# Patient Record
Sex: Male | Born: 1975 | Race: White | Hispanic: No | Marital: Married | Smoking: Current every day smoker
Health system: Southern US, Community
[De-identification: ages and names within clinical notes are randomized; demographics above are authoritative.]

---

## 2005-03-14 ENCOUNTER — Emergency Department (HOSPITAL_COMMUNITY): Admission: EM | Admit: 2005-03-14 | Discharge: 2005-03-14 | Payer: Self-pay | Admitting: Family Medicine

## 2005-06-21 ENCOUNTER — Emergency Department (HOSPITAL_COMMUNITY): Admission: EM | Admit: 2005-06-21 | Discharge: 2005-06-21 | Payer: Self-pay | Admitting: Emergency Medicine

## 2005-07-10 ENCOUNTER — Emergency Department (HOSPITAL_COMMUNITY): Admission: EM | Admit: 2005-07-10 | Discharge: 2005-07-10 | Payer: Self-pay | Admitting: Family Medicine

## 2006-04-20 ENCOUNTER — Emergency Department (HOSPITAL_COMMUNITY): Admission: EM | Admit: 2006-04-20 | Discharge: 2006-04-20 | Payer: Self-pay | Admitting: Emergency Medicine

## 2008-04-10 ENCOUNTER — Emergency Department (HOSPITAL_COMMUNITY): Admission: EM | Admit: 2008-04-10 | Discharge: 2008-04-10 | Payer: Self-pay | Admitting: Emergency Medicine

## 2008-04-16 ENCOUNTER — Emergency Department (HOSPITAL_COMMUNITY): Admission: EM | Admit: 2008-04-16 | Discharge: 2008-04-16 | Payer: Self-pay | Admitting: Emergency Medicine

## 2008-07-27 ENCOUNTER — Encounter: Admission: RE | Admit: 2008-07-27 | Discharge: 2008-08-24 | Payer: Self-pay | Admitting: Family Medicine

## 2010-06-30 ENCOUNTER — Encounter: Payer: Self-pay | Admitting: Emergency Medicine

## 2010-07-21 ENCOUNTER — Emergency Department (HOSPITAL_COMMUNITY)
Admission: EM | Admit: 2010-07-21 | Discharge: 2010-07-21 | Disposition: A | Discharge status: Home / Self Care | Payer: Managed Care, Other (non HMO) | Attending: Emergency Medicine | Admitting: Emergency Medicine

## 2010-07-21 DIAGNOSIS — Y929 Unspecified place or not applicable: Secondary | ICD-10-CM | POA: Insufficient documentation

## 2010-07-21 DIAGNOSIS — S7010XA Contusion of unspecified thigh, initial encounter: Secondary | ICD-10-CM | POA: Insufficient documentation

## 2010-07-21 DIAGNOSIS — M79609 Pain in unspecified limb: Secondary | ICD-10-CM | POA: Insufficient documentation

## 2010-07-21 DIAGNOSIS — W11XXXA Fall on and from ladder, initial encounter: Secondary | ICD-10-CM | POA: Insufficient documentation

## 2010-07-21 DIAGNOSIS — M7989 Other specified soft tissue disorders: Secondary | ICD-10-CM | POA: Insufficient documentation

## 2010-07-23 ENCOUNTER — Emergency Department (HOSPITAL_COMMUNITY)
Admission: EM | Admit: 2010-07-23 | Discharge: 2010-07-23 | Disposition: A | Discharge status: Home / Self Care | Payer: Managed Care, Other (non HMO) | Attending: Emergency Medicine | Admitting: Emergency Medicine

## 2010-07-23 DIAGNOSIS — R51 Headache: Secondary | ICD-10-CM | POA: Insufficient documentation

## 2010-07-23 DIAGNOSIS — R42 Dizziness and giddiness: Secondary | ICD-10-CM | POA: Insufficient documentation

## 2010-07-23 DIAGNOSIS — IMO0001 Reserved for inherently not codable concepts without codable children: Secondary | ICD-10-CM | POA: Insufficient documentation

## 2010-07-23 DIAGNOSIS — J069 Acute upper respiratory infection, unspecified: Secondary | ICD-10-CM | POA: Insufficient documentation

## 2010-07-23 DIAGNOSIS — J3489 Other specified disorders of nose and nasal sinuses: Secondary | ICD-10-CM | POA: Insufficient documentation

## 2010-07-23 DIAGNOSIS — R000 Tachycardia, unspecified: Secondary | ICD-10-CM | POA: Insufficient documentation

## 2010-07-23 DIAGNOSIS — H9209 Otalgia, unspecified ear: Secondary | ICD-10-CM | POA: Insufficient documentation

## 2014-05-02 ENCOUNTER — Emergency Department (HOSPITAL_COMMUNITY)
Admission: EM | Admit: 2014-05-02 | Discharge: 2014-05-02 | Disposition: A | Discharge status: Home / Self Care | Payer: Worker's Compensation | Attending: Emergency Medicine | Admitting: Emergency Medicine

## 2014-05-02 ENCOUNTER — Encounter (HOSPITAL_COMMUNITY): Payer: Self-pay | Admitting: Nurse Practitioner

## 2014-05-02 DIAGNOSIS — Y990 Civilian activity done for income or pay: Secondary | ICD-10-CM | POA: Insufficient documentation

## 2014-05-02 DIAGNOSIS — S0191XA Laceration without foreign body of unspecified part of head, initial encounter: Secondary | ICD-10-CM

## 2014-05-02 DIAGNOSIS — S0990XA Unspecified injury of head, initial encounter: Secondary | ICD-10-CM

## 2014-05-02 DIAGNOSIS — S0101XA Laceration without foreign body of scalp, initial encounter: Secondary | ICD-10-CM | POA: Diagnosis not present

## 2014-05-02 DIAGNOSIS — Z23 Encounter for immunization: Secondary | ICD-10-CM | POA: Insufficient documentation

## 2014-05-02 DIAGNOSIS — Y9269 Other specified industrial and construction area as the place of occurrence of the external cause: Secondary | ICD-10-CM | POA: Insufficient documentation

## 2014-05-02 DIAGNOSIS — Z720 Tobacco use: Secondary | ICD-10-CM | POA: Diagnosis not present

## 2014-05-02 DIAGNOSIS — W228XXA Striking against or struck by other objects, initial encounter: Secondary | ICD-10-CM | POA: Insufficient documentation

## 2014-05-02 DIAGNOSIS — Y9389 Activity, other specified: Secondary | ICD-10-CM | POA: Diagnosis not present

## 2014-05-02 MED ORDER — TETANUS-DIPHTH-ACELL PERTUSSIS 5-2.5-18.5 LF-MCG/0.5 IM SUSP
0.5000 mL | Freq: Once | INTRAMUSCULAR | Status: AC
  Administered 2014-05-02: 0.5 mL via INTRAMUSCULAR
  Filled 2014-05-02: qty 0.5

## 2014-05-02 MED ORDER — BACITRACIN ZINC 500 UNIT/GM EX OINT
TOPICAL_OINTMENT | CUTANEOUS | Status: AC
  Administered 2014-05-02: 1 via TOPICAL

## 2014-05-02 MED ORDER — BACITRACIN ZINC 500 UNIT/GM EX OINT
TOPICAL_OINTMENT | CUTANEOUS | Status: AC
  Filled 2014-05-02: qty 1.8

## 2014-05-02 NOTE — ED Provider Notes (Addendum)
CSN: [id number]     Arrival date & time 05/02/14  1900 History  This chart was scribed for non-physician practitioner working with Ward GivensIva L Knapp, MD by Angelene GiovanniEmmanuella Mensah, ED Scribe. The patient was seen in room WTR7/WTR7 and the patient's care was started at 8:18 PM    Chief Complaint  Patient presents with  . Head Laceration   The history is provided by the patient. No language interpreter was used.   HPI Comments: Arthur Wallace is a 38 y.o. male who presents to the Emergency Department complaining of a head laceration onset 2 hours ago after he was hit in the head with a sharp metal object on the top of his head while at work. He reports associated pain to the area. He states that at first he did not feel any pain but after a few minutes, he felt the pain. He denies LOC but his friend explains that he was "dazed" for a couple of minutes after he was hit. He denies HA, dizziness, or any other pain. He denies being on blood thinners. He denies being on blood thinners. His friend reports that his tetanus shot was UTD.   History reviewed. No pertinent past medical history. History reviewed. No pertinent past surgical history. No family history on file. History  Substance Use Topics  . Smoking status: Current Every Day Smoker -- 0.50 packs/day    Types: Cigarettes  . Smokeless tobacco: Former NeurosurgeonUser  . Alcohol Use: Yes    Review of Systems  Constitutional: Negative for fever and chills.  Cardiovascular: Negative for chest pain.  Gastrointestinal: Negative for abdominal pain.  Musculoskeletal: Negative for back pain and neck pain.  Skin:       Head Laceration  Neurological: Negative for dizziness and headaches.      Allergies  Review of patient's allergies indicates no known allergies.  Home Medications   Prior to Admission medications   Not on File   BP 136/83 mmHg  Pulse 105  Temp(Src) 98.5 F (36.9 C) (Oral)  Resp 22  Ht 5\' 7"  (1.702 m)  Wt 175 lb (79.379 kg)  BMI 27.40  kg/m2  SpO2 100% Physical Exam  Constitutional: He is oriented to person, place, and time. He appears well-developed and well-nourished.  HENT:  Head: Normocephalic and atraumatic.  Right Ear: External ear normal.  Left Ear: External ear normal.  Mouth/Throat: Oropharynx is clear and moist.  5cm laceration to the right scalp. Hemostatic. No scull deformity. No hemotympanum  Neck: Neck supple.  Cardiovascular: Normal rate.   Pulmonary/Chest: Effort normal.  Abdominal: He exhibits no distension.  Neurological: He is alert and oriented to person, place, and time.  Skin: Skin is warm and dry.  Psychiatric: He has a normal mood and affect.  Nursing note and vitals reviewed.   ED Course  Procedures (including critical care time) DIAGNOSTIC STUDIES: Oxygen Saturation is 100% on RA, normal by my interpretation.    COORDINATION OF CARE: 8:25 PM- Pt advised of plan for treatment and pt agrees.    Labs Review Labs Reviewed - No data to display  Imaging Review No results found.   EKG Interpretation None      MDM   Final diagnoses:  Laceration of head, initial encounter  Minor head injury, initial encounter    Patient is here with a scalp laceration after a sharp metal bar fell on his head. Tetanus updated in ED. Repaired laceration with staples. No evidence of major head trauma, no further imaging indicated  based on [place] rules. Will discharge home with strict precautions.  Filed Vitals:   05/02/14 1919  BP: 136/83  Pulse: 105  Temp: 98.5 F (36.9 C)  Resp: 22     I personally performed the services described in this documentation, which was scribed in my presence. The recorded information has been reviewed and is accurate.    Lottie Musselatyana A Tiny Chaudhary, PA-C 05/02/14 2052  Ward GivensIva L Knapp, MD 05/02/14 2100  Addendum: procedure note  LACERATION REPAIR Performed by: Lottie MusselKIRICHENKO, Antavia Tandy A Authorized by: Jaynie CrumbleKIRICHENKO, Kachina Niederer A Consent: Verbal consent  obtained. Risks and benefits: risks, benefits and alternatives were discussed Consent given by: patient Patient identity confirmed: provided demographic data Prepped and Draped in normal sterile fashion Wound explored  Laceration Location: scalp  Laceration Length: 5cm  No Foreign Bodies seen or palpated  Anesthesia:none Amount of cleaning: standard  Skin closure: staples  Number of sutures: 4  Patient tolerance: Patient tolerated the procedure well with no immediate complications.   Lottie Musselatyana A Treyson Axel, PA-C 05/02/14 2216  Ward GivensIva L Knapp, MD 05/02/14 2250

## 2014-05-02 NOTE — Discharge Instructions (Signed)
Ibuprofen and tylenol for pain. Ice. Bacitracin twice a day. Follow up with primary care doctor or urgent care for staple removal.    Head Injury You have received a head injury. It does not appear serious at this time. Headaches and vomiting are common following head injury. It should be easy to awaken from sleeping. Sometimes it is necessary for you to stay in the emergency department for a while for observation. Sometimes admission to the hospital may be needed. After injuries such as yours, most problems occur within the first 24 hours, but side effects may occur up to 7-10 days after the injury. It is important for you to carefully monitor your condition and contact your health care provider or seek immediate medical care if there is a change in your condition. WHAT ARE THE TYPES OF HEAD INJURIES? Head injuries can be as minor as a bump. Some head injuries can be more severe. More severe head injuries include:  A jarring injury to the brain (concussion).  A bruise of the brain (contusion). This mean there is bleeding in the brain that can cause swelling.  A cracked skull (skull fracture).  Bleeding in the brain that collects, clots, and forms a bump (hematoma). WHAT CAUSES A HEAD INJURY? A serious head injury is most likely to happen to someone who is in a car wreck and is not wearing a seat belt. Other causes of major head injuries include bicycle or motorcycle accidents, sports injuries, and falls. HOW ARE HEAD INJURIES DIAGNOSED? A complete history of the event leading to the injury and your current symptoms will be helpful in diagnosing head injuries. Many times, pictures of the brain, such as CT or MRI are needed to see the extent of the injury. Often, an overnight hospital stay is necessary for observation.  WHEN SHOULD I SEEK IMMEDIATE MEDICAL CARE?  You should get help right away if:  You have confusion or drowsiness.  You feel sick to your stomach (nauseous) or have continued,  forceful vomiting.  You have dizziness or unsteadiness that is getting worse.  You have severe, continued headaches not relieved by medicine. Only take over-the-counter or prescription medicines for pain, fever, or discomfort as directed by your health care provider.  You do not have normal function of the arms or legs or are unable to walk.  You notice changes in the black spots in the center of the colored part of your eye (pupil).  You have a clear or bloody fluid coming from your nose or ears.  You have a loss of vision. During the next 24 hours after the injury, you must stay with someone who can watch you for the warning signs. This person should contact local emergency services (911 in the [place] if you have seizures, you become unconscious, or you are unable to wake up. HOW CAN I PREVENT A HEAD INJURY IN THE FUTURE? The most important factor for preventing major head injuries is avoiding motor vehicle accidents. To minimize the potential for damage to your head, it is crucial to wear seat belts while riding in motor vehicles. Wearing helmets while bike riding and playing collision sports (like football) is also helpful. Also, avoiding dangerous activities around the house will further help reduce your risk of head injury.  WHEN CAN I RETURN TO NORMAL ACTIVITIES AND ATHLETICS? You should be reevaluated by your health care provider before returning to these activities. If you have any of the following symptoms, you should not return to activities or  contact sports until 1 week after the symptoms have stopped:  Persistent headache.  Dizziness or vertigo.  Poor attention and concentration.  Confusion.  Memory problems.  Nausea or vomiting.  Fatigue or tire easily.  Irritability.  Intolerant of bright lights or loud noises.  Anxiety or depression.  Disturbed sleep. MAKE SURE YOU:   Understand these instructions.  Will watch your condition.  Will get help right away if  you are not doing well or get worse. Document Released: 05/26/2005 Document Revised: 05/31/2013 Document Reviewed: 01/31/2013 [facility] Patient Information 2015 [place], [place]. This information is not intended to replace advice given to you by your health care provider. Make sure you discuss any questions you have with your health care provider.  Staple Care and Removal Your caregiver has used staples today to repair your wound. Staples are used to help a wound heal faster by holding the edges of the wound together. The staples can be removed when the wound has healed well enough to stay together after the staples are removed. A dressing (wound covering), depending on the location of the wound, may have been applied. This may be changed once per day or as instructed. If the dressing sticks, it may be soaked off with soapy water or hydrogen peroxide. Only take over-the-counter or prescription medicines for pain, discomfort, or fever as directed by your caregiver.  If you did not receive a tetanus shot today because you did not recall when your last one was given, check with your caregiver when you have your staples removed to determine if one is needed. Return to your caregiver's office in 1 week or as suggested to have your staples removed. SEEK IMMEDIATE MEDICAL CARE IF:   You have redness, swelling, or increasing pain in the wound.  You have pus coming from the wound.  You have a fever.  You notice a bad smell coming from the wound or dressing.  Your wound edges break open after staples have been removed. Document Released: 02/18/2001 Document Revised: 08/18/2011 Document Reviewed: 03/05/2005 [facility] Patient Information 2015 [place], [place]. This information is not intended to replace advice given to you by your health care provider. Make sure you discuss any questions you have with your health care provider.

## 2014-05-02 NOTE — ED Notes (Signed)
Pt presents with 5cm long head laceration. States a metal road feel on him while he was at work. Scant bleeding at this time, denes being blood thinners, unknown status for tetanus shot.

## 2014-05-10 ENCOUNTER — Emergency Department (HOSPITAL_COMMUNITY)
Admission: EM | Admit: 2014-05-10 | Discharge: 2014-05-10 | Disposition: A | Discharge status: Home / Self Care | Payer: Worker's Compensation | Attending: Emergency Medicine | Admitting: Emergency Medicine

## 2014-05-10 ENCOUNTER — Encounter (HOSPITAL_COMMUNITY): Payer: Self-pay | Admitting: Emergency Medicine

## 2014-05-10 DIAGNOSIS — Z4802 Encounter for removal of sutures: Secondary | ICD-10-CM

## 2014-05-10 DIAGNOSIS — Z720 Tobacco use: Secondary | ICD-10-CM | POA: Diagnosis not present

## 2014-05-10 MED ORDER — TRAMADOL HCL 50 MG PO TABS
50.0000 mg | ORAL_TABLET | Freq: Once | ORAL | Status: AC
  Administered 2014-05-10: 50 mg via ORAL
  Filled 2014-05-10: qty 1

## 2014-05-10 MED ORDER — BACITRACIN ZINC 500 UNIT/GM EX OINT: 1.0000 "application " | TOPICAL_OINTMENT | Freq: Two times a day (BID) | CUTANEOUS | Status: DC

## 2014-05-10 NOTE — ED Notes (Signed)
Pt presents for staple removal. Staples placed at our facility 1 week ago in the top of his head. Alert and oriented.

## 2014-05-10 NOTE — ED Provider Notes (Signed)
CSN: [id number]     Arrival date & time 05/10/14  1552 History  This chart was scribed for non-physician practitioner, Marlon Peliffany Pieper Kasik, PA-C working with Mirian MoMatthew Gentry, MD by Greggory StallionKayla Andersen, ED scribe. This patient was seen in room WTR7/WTR7 and the patient's care was started at 4:13 PM.   Chief Complaint  Patient presents with  . Suture / Staple Removal   The history is provided by the patient. No language interpreter was used.    HPI Comments: Arthur Wallace is a 38 y.o. male who presents to the Emergency Department requesting staple removal. Pt had 4 staples placed to his scalp 8 days ago. States he originally got the injury at work where a piece of metal fell on his head. Denies pain or drainage from the area but reports mild headaches.   History reviewed. No pertinent past medical history. History reviewed. No pertinent past surgical history. History reviewed. No pertinent family history. History  Substance Use Topics  . Smoking status: Current Every Day Smoker -- 0.50 packs/day    Types: Cigarettes  . Smokeless tobacco: Former NeurosurgeonUser  . Alcohol Use: Yes    Review of Systems  Skin: Positive for wound.  Neurological: Positive for headaches.  All other systems reviewed and are negative.  Allergies  Review of patient's allergies indicates no known allergies.  Home Medications   Prior to Admission medications   Not on File   BP 127/79 mmHg  Pulse 100  Temp(Src) 98.6 F (37 C) (Oral)  SpO2 100%   Physical Exam  Constitutional: He is oriented to person, place, and time. He appears well-developed and well-nourished. No distress.  HENT:  Head: Normocephalic and atraumatic.    Eyes: Conjunctivae and EOM are normal.  Neck: Neck supple. No tracheal deviation present.  Cardiovascular: Normal rate.   Pulmonary/Chest: Effort normal. No respiratory distress.  Musculoskeletal: Normal range of motion.  Neurological: He is alert and oriented to person, place, and time.  Skin:  Skin is warm and dry.  Psychiatric: He has a normal mood and affect. His behavior is normal.  Nursing note and vitals reviewed.   ED Course  Procedures (including critical care time)  SUTURE REMOVAL Performed by: Marlon Peliffany Branko Steeves, PA-C Authorized by: Marlon Peliffany Kairo Laubacher, PA-C Consent: Verbal consent obtained. Consent given by: patient Required items: required blood products, implants, devices, and special equipment available  Time out: Immediately prior to procedure a "time out" was called to verify the correct patient, procedure, equipment, support staff and site/side marked as required. Location: scalp Wound Appearance: clean, well healing  Staples Removed: 4 Patient tolerance: Patient tolerated the procedure well with no immediate complications.   DIAGNOSTIC STUDIES: Oxygen Saturation is 100% on RA, normal by my interpretation.    COORDINATION OF CARE: 4:14 PM-Discussed treatment plan which includes staple removal with pt at bedside and pt agreed to plan.   Labs Review Labs Reviewed - No data to display  Imaging Review No results found.   EKG Interpretation None      MDM   Final diagnoses:  Encounter for staple removal    SUTURE REMOVAL Performed by: Dorthula MatasGREENE,Ianmichael Amescua G  Consent: Verbal consent obtained. Patient identity confirmed: provided demographic data Time out: Immediately prior to procedure a "time out" was called to verify the correct patient, procedure, equipment, support staff and site/side marked as required.  Location details: scalp  Wound Appearance: clean  Sutures/Staples Removed: 4  Facility: sutures placed in this facility Patient tolerance: Patient tolerated the procedure well with no immediate  complications.   38 y.o.Arthur Wallace's evaluation in the Emergency Department is complete. It has been determined that no acute conditions requiring further emergency intervention are present at this time. The patient/guardian have been advised of the  diagnosis and plan. We have discussed signs and symptoms that warrant return to the ED, such as changes or worsening in symptoms.  Vital signs are stable at discharge. Filed Vitals:   05/10/14 1559  BP: 127/79  Pulse: 100  Temp: 98.6 F (37 C)    Patient/guardian has voiced understanding and agreed to follow-up with the PCP or specialist.   I personally performed the services described in this documentation, which was scribed in my presence. The recorded information has been reviewed and is accurate.  Dorthula Matasiffany G Ovid Witman, PA-C 05/10/14 1619  Mirian MoMatthew Gentry, MD 05/14/14 [id number]

## 2014-05-10 NOTE — Discharge Instructions (Signed)
Incision Care An incision is when a surgeon cuts into your body tissues. After surgery, the incision needs to be cared for properly to prevent infection.  HOME CARE INSTRUCTIONS   Take all medicine as directed by your caregiver. Only take over-the-counter or prescription medicines for pain, discomfort, or fever as directed by your caregiver.  Do not remove your bandage (dressing) or get your incision wet until your surgeon gives you permission. In the event that your dressing becomes wet, dirty, or starts to smell, change the dressing and call your surgeon for instructions as soon as possible.  Take showers. Do not take tub baths, swim, or do anything that may soak the wound until it is healed.  Resume your normal diet and activities as directed or allowed.  Avoid lifting any weight until you are instructed otherwise.  Use anti-itch antihistamine medicine as directed by your caregiver. The wound may itch when it is healing. Do not pick or scratch at the wound.  Follow up with your caregiver for stitch (suture) or staple removal as directed.  Drink enough fluids to keep your urine clear or pale yellow. SEEK MEDICAL CARE IF:   You have redness, swelling, or increasing pain in the wound that is not controlled with medicine.  You have drainage, blood, or pus coming from the wound that lasts longer than 1 day.  You develop muscle aches, chills, or a general ill feeling.  You notice a bad smell coming from the wound or dressing.  Your wound edges separate after the sutures, staples, or skin adhesive strips have been removed.  You develop persistent nausea or vomiting. SEEK IMMEDIATE MEDICAL CARE IF:   You have a fever.  You develop a rash.  You develop dizzy episodes or faint while standing.  You have difficulty breathing.  You develop any reaction or side effects to medicine given. MAKE SURE YOU:   Understand these instructions.  Will watch your condition.  Will get help  right away if you are not doing well or get worse. Document Released: 12/13/2004 Document Revised: 08/18/2011 Document Reviewed: 07/20/2013 [facility] Patient Information 2015 [place]resExitCare, [place]LLC. This information is not intended to replace advice given to you by your health care provider. Make sure you discuss any questions you have with your health care provider.  Suture Removal, Care After Refer to this sheet in the next few weeks. These instructions provide you with information on caring for yourself after your procedure. Your health care provider may also give you more specific instructions. Your treatment has been planned according to current medical practices, but problems sometimes occur. Call your health care provider if you have any problems or questions after your procedure. WHAT TO EXPECT AFTER THE PROCEDURE After your stitches (sutures) are removed, it is typical to have the following:  Some discomfort and swelling in the wound area.  Slight redness in the area. HOME CARE INSTRUCTIONS   If you have skin adhesive strips over the wound area, do not take the strips off. They will fall off on their own in a few days. If the strips remain in place after 14 days, you may remove them.  Change any bandages (dressings) at least once a day or as directed by your health care provider. If the bandage sticks, soak it off with warm, soapy water.  Apply cream or ointment only as directed by your health care provider. If using cream or ointment, wash the area with soap and water 2 times a day to remove all  the cream or ointment. Rinse off the soap and pat the area dry with a clean towel.  Keep the wound area dry and clean. If the bandage becomes wet or dirty, or if it develops a bad smell, change it as soon as possible.  Continue to protect the wound from injury.  Use sunscreen when out in the sun. New scars become sunburned easily. SEEK MEDICAL CARE IF:  You have increasing redness, swelling, or  pain in the wound.  You see pus coming from the wound.  You have a fever.  You notice a bad smell coming from the wound or dressing.  Your wound breaks open (edges not staying together). Document Released: 02/18/2001 Document Revised: 03/16/2013 Document Reviewed: 01/05/2013 [facility] Patient Information 2015 [place]rineExitCare, [place]LLC. This information is not intended to replace advice given to you by your health care provider. Make sure you discuss any questions you have with your health care provider.

## 2014-07-13 ENCOUNTER — Ambulatory Visit (INDEPENDENT_AMBULATORY_CARE_PROVIDER_SITE_OTHER): Payer: Self-pay | Admitting: Family Medicine

## 2014-07-13 VITALS — BP 106/66 | HR 88 | Temp 98.1°F | Resp 16 | Ht 67.0 in | Wt 187.6 lb

## 2014-07-13 DIAGNOSIS — Z024 Encounter for examination for driving license: Secondary | ICD-10-CM

## 2014-07-13 DIAGNOSIS — Z029 Encounter for administrative examinations, unspecified: Secondary | ICD-10-CM

## 2014-07-13 NOTE — Progress Notes (Signed)
Physical examination for DOT  Physical exam: Patient is here for his DOT physical examination. He has no major acute medical complaints    Past medical history: Operations: None Medical illnesses: None Medications: None Allergies: None   Social history: [place] is single, [place]. 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 His family is all overseas. He uses vapors, not smoking cigarettes currently he does intend to quit.   Review of systems: Constitutional: Unremarkable HEENT: Unremarkable Does not wear glasses Respiratory: Unremarkable Cardiovascular: Unremarkable Gastrointestinal: Unremarkable Genitourinary: Unremarkable Muscular skeletal: Unremarkable Neurologic: Unremarkable Psychiatric: Unremarkable Dermatologic: Unremarkable Endocrinologic: Unremarkable  Physical examination: Well-developed well-nourished man in no acute distress. TMs normal. Eyes PERRLA. Short stature. Throat clear. Neck supple without nodes or thyromegaly. No carotid bruits. Chest is clear to auscultation. Heart regular without murmurs gallops or arrhythmias. Abdomen soft without masses or tenderness. Normal male external genitalia, testes descended. No hernias. Extremities unremarkable. Skin unremarkable. Has a benign nevus on his posterior calf left leg.  Assessment: Normal physical examination for DOT  Plan: 2 year DOT card Return if needed

## 2014-07-13 NOTE — Patient Instructions (Signed)
Return as necessary.  DOT card is for 2 years

## 2014-12-20 ENCOUNTER — Other Ambulatory Visit: Payer: Self-pay | Admitting: Internal Medicine

## 2014-12-20 DIAGNOSIS — R222 Localized swelling, mass and lump, trunk: Secondary | ICD-10-CM

## 2014-12-27 ENCOUNTER — Ambulatory Visit: Payer: Managed Care, Other (non HMO)

## 2014-12-27 ENCOUNTER — Other Ambulatory Visit: Payer: Self-pay

## 2014-12-28 ENCOUNTER — Ambulatory Visit
Admission: RE | Admit: 2014-12-28 | Discharge: 2014-12-28 | Disposition: A | Discharge status: Home / Self Care | Payer: Self-pay | Source: Ambulatory Visit | Attending: Internal Medicine | Admitting: Internal Medicine

## 2014-12-28 ENCOUNTER — Other Ambulatory Visit: Payer: Self-pay | Admitting: Internal Medicine

## 2014-12-28 ENCOUNTER — Ambulatory Visit
Admission: RE | Admit: 2014-12-28 | Discharge: 2014-12-28 | Disposition: A | Discharge status: Home / Self Care | Payer: Managed Care, Other (non HMO) | Source: Ambulatory Visit | Attending: Internal Medicine | Admitting: Internal Medicine

## 2014-12-28 DIAGNOSIS — R928 Other abnormal and inconclusive findings on diagnostic imaging of breast: Secondary | ICD-10-CM | POA: Insufficient documentation

## 2014-12-28 DIAGNOSIS — R222 Localized swelling, mass and lump, trunk: Secondary | ICD-10-CM

## 2015-01-01 ENCOUNTER — Other Ambulatory Visit: Payer: Self-pay

## 2015-01-01 ENCOUNTER — Ambulatory Visit: Admission: RE | Admit: 2015-01-01 | Payer: Managed Care, Other (non HMO) | Source: Ambulatory Visit

## 2017-06-12 ENCOUNTER — Encounter: Payer: Self-pay | Admitting: Urgent Care

## 2017-06-12 ENCOUNTER — Ambulatory Visit: Payer: Self-pay | Admitting: Urgent Care

## 2017-06-12 VITALS — BP 110/72 | HR 95 | Temp 98.5°F | Resp 16 | Ht 67.0 in | Wt 183.6 lb

## 2017-06-12 DIAGNOSIS — Z024 Encounter for examination for driving license: Secondary | ICD-10-CM

## 2017-06-12 DIAGNOSIS — Z720 Tobacco use: Secondary | ICD-10-CM

## 2017-06-12 NOTE — Progress Notes (Signed)
   Airline pilotCommercial Driver Medical Examination   Arthur Wallace is a 42 y.o. male who presents today for a DOT physical exam. The patient reports smoking 1/2 ppd. Denies dizziness, chronic headache, blurred vision, chest pain, shortness of breath, heart racing, palpitations, nausea, vomiting, abdominal pain, hematuria, lower leg swelling.  The following portions of the patient's history were reviewed and updated as appropriate: allergies, current medications, past family history, past medical history, past social history and past surgical history.  Objective:   BP 110/72   Pulse 95   Temp 98.5 F (36.9 C) (Oral)   Resp 16   Ht 5\' 7"  (1.702 m)   Wt 183 lb 9.6 oz (83.3 kg)   SpO2 97%   BMI 28.76 kg/m   Vision/hearing:  Visual Acuity Screening   Right eye Left eye Both eyes  Without correction: 20/13-1 20/13 20/13  With correction:     Comments: Peripheral Vision: Right eye 85 degrees. Left eye 85 degrees.  The patient can distinguish the colors red, amber and green.  Hearing Screening Comments: The patient was able to hear a forced whisper from 10 feet.  Patient can recognize and distinguish among traffic control signals and devices showing standard red, green, and amber colors.  Corrective lenses required: No  Monocular Vision?: No  Hearing aid requirement: No  Physical Exam  Constitutional: He is oriented to person, place, and time. He appears well-developed and well-nourished.  HENT:  TM's intact bilaterally, no effusions or erythema. Nasal turbinates pink and moist, nasal passages patent. No sinus tenderness. Oropharynx clear, mucous membranes moist, dentition in good repair.  Eyes: Conjunctivae and EOM are normal. Pupils are equal, round, and reactive to light. Right eye exhibits no discharge. Left eye exhibits no discharge. No scleral icterus.  Neck: Normal range of motion. Neck supple. No thyromegaly present.  Cardiovascular: Normal rate, regular rhythm and intact  distal pulses. Exam reveals no gallop and no friction rub.  No murmur heard. Pulmonary/Chest: No stridor. No respiratory distress. He has no wheezes. He has no rales.  Abdominal: Soft. Bowel sounds are normal. He exhibits no distension and no mass. There is no tenderness.  Musculoskeletal: Normal range of motion. He exhibits no edema or tenderness.  Lymphadenopathy:    He has no cervical adenopathy.  Neurological: He is alert and oriented to person, place, and time. He has normal reflexes.  Skin: Skin is warm and dry. No rash noted. No erythema. No pallor.  Psychiatric: He has a normal mood and affect.    Labs: Comments: Urine Specimen:  SpGr:  1.020  Protein:  Trace   Sugar:  Negative  Blood: Negative  Assessment:    Healthy male exam.  Meets standards in 9149 CFR 391.41;  qualifies for 2 year certificate.    Plan:   Medical examiners certificate completed and printed. Return as needed.  Wallis BambergMario Draedyn Weidinger, PA-C [facility] [phone] 06/12/2017  4:46 PM

## 2017-06-12 NOTE — Patient Instructions (Signed)
Health Maintenance, Male A healthy lifestyle and preventive care is important for your health and wellness. Ask your health care provider about what schedule of regular examinations is right for you. What should I know about weight and diet? Eat a Healthy Diet  Eat plenty of vegetables, fruits, whole grains, low-fat dairy products, and lean protein.  Do not eat a lot of foods high in solid fats, added sugars, or salt.  Maintain a Healthy Weight Regular exercise can help you achieve or maintain a healthy weight. You should:  Do at least 150 minutes of exercise each week. The exercise should increase your heart rate and make you sweat (moderate-intensity exercise).  Do strength-training exercises at least twice a week.  Watch Your Levels of Cholesterol and Blood Lipids  Have your blood tested for lipids and cholesterol every 5 years starting at 43 years of age. If you are at high risk for heart disease, you should start having your blood tested when you are 43 years old. You may need to have your cholesterol levels checked more often if: ? Your lipid or cholesterol levels are high. ? You are older than 43 years of age. ? You are at high risk for heart disease.  What should I know about cancer screening? Many types of cancers can be detected early and may often be prevented. Lung Cancer  You should be screened every year for lung cancer if: ? You are a current smoker who has smoked for at least 30 years. ? You are a former smoker who has quit within the past 15 years.  Talk to your health care provider about your screening options, when you should start screening, and how often you should be screened.  Colorectal Cancer  Routine colorectal cancer screening usually begins at 43 years of age and should be repeated every 5-10 years until you are 43 years old. You may need to be screened more often if early forms of precancerous polyps or small growths are found. Your health care provider  may recommend screening at an earlier age if you have risk factors for colon cancer.  Your health care provider may recommend using home test kits to check for hidden blood in the stool.  A small camera at the end of a tube can be used to examine your colon (sigmoidoscopy or colonoscopy). This checks for the earliest forms of colorectal cancer.  Prostate and Testicular Cancer  Depending on your age and overall health, your health care provider may do certain tests to screen for prostate and testicular cancer.  Talk to your health care provider about any symptoms or concerns you have about testicular or prostate cancer.  Skin Cancer  Check your skin from head to toe regularly.  Tell your health care provider about any new moles or changes in moles, especially if: ? There is a change in a mole's size, shape, or color. ? You have a mole that is larger than a pencil eraser.  Always use sunscreen. Apply sunscreen liberally and repeat throughout the day.  Protect yourself by wearing long sleeves, pants, a wide-brimmed hat, and sunglasses when outside.  What should I know about heart disease, diabetes, and high blood pressure?  If you are 18-39 years of age, have your blood pressure checked every 3-5 years. If you are 40 years of age or older, have your blood pressure checked every year. You should have your blood pressure measured twice-once when you are at a hospital or clinic, and once when   you are not at a hospital or clinic. Record the average of the two measurements. To check your blood pressure when you are not at a hospital or clinic, you can use: ? An automated blood pressure machine at a pharmacy. ? A home blood pressure monitor.  Talk to your health care provider about your target blood pressure.  If you are between 45-79 years old, ask your health care provider if you should take aspirin to prevent heart disease.  Have regular diabetes screenings by checking your fasting blood  sugar level. ? If you are at a normal weight and have a low risk for diabetes, have this test once every three years after the age of 45. ? If you are overweight and have a high risk for diabetes, consider being tested at a younger age or more often.  A one-time screening for abdominal aortic aneurysm (AAA) by ultrasound is recommended for men aged 65-75 years who are current or former smokers. What should I know about preventing infection? Hepatitis B If you have a higher risk for hepatitis B, you should be screened for this virus. Talk with your health care provider to find out if you are at risk for hepatitis B infection. Hepatitis C Blood testing is recommended for:  Everyone born from 1945 through 1965.  Anyone with known risk factors for hepatitis C.  Sexually Transmitted Diseases (STDs)  You should be screened each year for STDs including gonorrhea and chlamydia if: ? You are sexually active and are younger than 43 years of age. ? You are older than 43 years of age and your health care provider tells you that you are at risk for this type of infection. ? Your sexual activity has changed since you were last screened and you are at an increased risk for chlamydia or gonorrhea. Ask your health care provider if you are at risk.  Talk with your health care provider about whether you are at high risk of being infected with HIV. Your health care provider may recommend a prescription medicine to help prevent HIV infection.  What else can I do?  Schedule regular health, dental, and eye exams.  Stay current with your vaccines (immunizations).  Do not use any tobacco products, such as cigarettes, chewing tobacco, and e-cigarettes. If you need help quitting, ask your health care provider.  Limit alcohol intake to no more than 2 drinks per day. One drink equals 12 ounces of beer, 5 ounces of wine, or 1 ounces of hard liquor.  Do not use street drugs.  Do not share needles.  Ask your  health care provider for help if you need support or information about quitting drugs.  Tell your health care provider if you often feel depressed.  Tell your health care provider if you have ever been abused or do not feel safe at home. This information is not intended to replace advice given to you by your health care provider. Make sure you discuss any questions you have with your health care provider. Document Released: 11/22/2007 Document Revised: 01/23/2016 Document Reviewed: 02/27/2015 Elsevier Interactive Patient Education  2018 Elsevier [employer].     IF you received an x-ray today, you will receive an invoice from [facility]. Please contact [facility] at [phone] with questions or concerns regarding your invoice.   IF you received labwork today, you will receive an invoice from [facility]. Please contact [facility] at [phone] with questions or concerns regarding your invoice.   Our billing staff will not be   able to assist you with questions regarding bills from these companies.  You will be contacted with the lab results as soon as they are available. The fastest way to get your results is to activate your My Chart account. Instructions are located on the last page of this paperwork. If you have not heard from us regarding the results in 2 weeks, please contact this office.       

## 2019-02-25 ENCOUNTER — Encounter (HOSPITAL_COMMUNITY): Payer: Self-pay

## 2019-02-25 ENCOUNTER — Other Ambulatory Visit: Payer: Self-pay

## 2019-02-25 ENCOUNTER — Emergency Department (HOSPITAL_COMMUNITY): Payer: No Typology Code available for payment source

## 2019-02-25 ENCOUNTER — Emergency Department (HOSPITAL_COMMUNITY)
Admission: EM | Admit: 2019-02-25 | Discharge: 2019-02-26 | Disposition: A | Discharge status: Home / Self Care | Payer: No Typology Code available for payment source | Attending: Emergency Medicine | Admitting: Emergency Medicine

## 2019-02-25 DIAGNOSIS — Y9289 Other specified places as the place of occurrence of the external cause: Secondary | ICD-10-CM | POA: Insufficient documentation

## 2019-02-25 DIAGNOSIS — Y9389 Activity, other specified: Secondary | ICD-10-CM | POA: Insufficient documentation

## 2019-02-25 DIAGNOSIS — W109XXA Fall (on) (from) unspecified stairs and steps, initial encounter: Secondary | ICD-10-CM | POA: Insufficient documentation

## 2019-02-25 DIAGNOSIS — S52125A Nondisplaced fracture of head of left radius, initial encounter for closed fracture: Secondary | ICD-10-CM

## 2019-02-25 DIAGNOSIS — Y990 Civilian activity done for income or pay: Secondary | ICD-10-CM | POA: Insufficient documentation

## 2019-02-25 DIAGNOSIS — S59902A Unspecified injury of left elbow, initial encounter: Secondary | ICD-10-CM | POA: Diagnosis present

## 2019-02-25 DIAGNOSIS — F17210 Nicotine dependence, cigarettes, uncomplicated: Secondary | ICD-10-CM | POA: Insufficient documentation

## 2019-02-25 MED ORDER — OXYCODONE-ACETAMINOPHEN 5-325 MG PO TABS
1.0000 | ORAL_TABLET | Freq: Once | ORAL | Status: DC
  Filled 2019-02-25: qty 1

## 2019-02-25 NOTE — ED Triage Notes (Signed)
Pt states he fell at work today, fx of proximal end of right ulna of left arm. Pt went to urgent care this morning and received referral to orthopedic surgery. Pt states that they cannot see him until Monday and he does not want to wait that long.

## 2019-02-25 NOTE — ED Notes (Signed)
Pt refusing XR.

## 2019-02-25 NOTE — ED Notes (Signed)
Pt is alert and oriented x 4 and is verbally responsive. Pt reports that he had a fall today at work and has a fracture to left arm 10/10 pain.

## 2019-02-25 NOTE — ED Provider Notes (Signed)
[facility] DEPT Provider Note   CSN: [id number] Arrival date & time: 02/25/19  1752     History   Chief Complaint Chief Complaint  Patient presents with  . Arm Pain    HPI Arthur Wallace is a 43 y.o. male.     Patient presents with left elbow pain.  States he fell while at work yesterday about down 5 or 6 steps and landed on his left elbow.  He did not hit his head.  He denies any other injury.  Presented to urgent care who told him he had a elbow fracture and referred him to orthopedics.  He comes in tonight because she is concerned that orthopedics cannot see him until Monday he has worsening pain and swelling.  He denies any numbness or tingling.  He reports swelling of his entire forearm and hand with reduced range of motion of his wrist and fingers.  He denies any head, neck, back, chest or abdominal pain.  He is uncertain what he is been taking for pain.  Denies any other medical history.  The history is provided by the patient.  Arm Pain Pertinent negatives include no chest pain, no abdominal pain, no headaches and no shortness of breath.    History reviewed. No pertinent past medical history.  There are no active problems to display for this patient.   History reviewed. No pertinent surgical history.      Home Medications    Prior to Admission medications   Not on File    Family History No family history on file.  Social History Social History   Tobacco Use  . Smoking status: Current Every Day Smoker    Packs/day: 0.50    Types: Cigarettes  . Smokeless tobacco: Former Network engineer Use Topics  . Alcohol use: Yes  . Drug use: No     Allergies   Patient has no known allergies.   Review of Systems Review of Systems  Constitutional: Negative for activity change, appetite change and fever.  HENT: Negative for congestion and rhinorrhea.   Eyes: Negative for visual disturbance.  Respiratory: Negative for cough,  chest tightness and shortness of breath.   Cardiovascular: Negative for chest pain.  Gastrointestinal: Negative for abdominal pain, nausea and vomiting.  Genitourinary: Negative for dysuria and hematuria.  Musculoskeletal: Positive for arthralgias and myalgias.  Skin: Negative for rash.  Neurological: Negative for dizziness, weakness and headaches.    all other systems are negative except as noted in the HPI and PMH.    Physical Exam Updated Vital Signs BP (!) 161/99   Pulse 98   Temp 98.4 F (36.9 C) (Oral)   Resp 16   SpO2 99%   Physical Exam Vitals signs and nursing note reviewed.  Constitutional:      General: He is not in acute distress.    Appearance: He is well-developed.  HENT:     Head: Normocephalic and atraumatic.     Mouth/Throat:     Pharynx: No oropharyngeal exudate.  Eyes:     Conjunctiva/sclera: Conjunctivae normal.     Pupils: Pupils are equal, round, and reactive to light.  Neck:     Musculoskeletal: Normal range of motion and neck supple.     Comments: No meningismus. Cardiovascular:     Rate and Rhythm: Normal rate and regular rhythm.     Heart sounds: Normal heart sounds. No murmur.  Pulmonary:     Effort: Pulmonary effort is normal. No respiratory distress.  Breath sounds: Normal breath sounds.  Abdominal:     Palpations: Abdomen is soft.     Tenderness: There is no abdominal tenderness. There is no guarding or rebound.  Musculoskeletal: Normal range of motion.        General: Swelling and tenderness present.     Comments: Edema and tenderness to left elbow held in flexed position.  Overlying abrasion.  Mild swelling of hand and forearm.  Intact radial pulse and cardinal hand movements. Some weakness extending wrist and extending thumb possibly due to pain.  Skin:    General: Skin is warm.     Capillary Refill: Capillary refill takes less than 2 seconds.  Neurological:     General: No focal deficit present.     Mental Status: He is alert  and oriented to person, place, and time. Mental status is at baseline.     Cranial Nerves: No cranial nerve deficit.     Motor: No abnormal muscle tone.     Coordination: Coordination normal.     Comments: No ataxia on finger to nose bilaterally. No pronator drift. 5/5 strength throughout. CN 2-12 intact.Equal grip strength. Sensation intact.   Psychiatric:        Behavior: Behavior normal.      ED Treatments / Results  Labs (all labs ordered are listed, but only abnormal results are displayed) Labs Reviewed - No data to display  EKG None  Radiology Dg Elbow Complete Left  Result Date: 02/26/2019 CLINICAL DATA:  Pain EXAM: LEFT ELBOW - COMPLETE 3+ VIEW COMPARISON:  None. FINDINGS: There is an acute fracture of the radial head. There is a large joint effusion. There is no dislocation. IMPRESSION: Acute fracture of the radial head. Electronically Signed   By: Katherine Mantle M.D.   On: 02/26/2019 01:04   Dg Wrist Complete Left  Result Date: 02/26/2019 CLINICAL DATA:  Pain EXAM: LEFT WRIST - COMPLETE 3+ VIEW COMPARISON:  None. FINDINGS: There is no evidence of fracture or dislocation. There is no evidence of arthropathy or other focal bone abnormality. Soft tissues are unremarkable. IMPRESSION: Negative. Electronically Signed   By: Katherine Mantle M.D.   On: 02/26/2019 01:08    Procedures Procedures (including critical care time)  Medications Ordered in ED Medications  oxyCODONE-acetaminophen (PERCOCET/ROXICET) 5-325 MG per tablet 1 tablet (has no administration in time range)     Initial Impression / Assessment and Plan / ED Course  I have reviewed the triage vital signs and the nursing notes.  Pertinent labs & imaging results that were available during my care of the patient were reviewed by me and considered in my medical decision making (see chart for details).       Fal With left elbow pain.  No head injury.  X-ray reviewed shows nondisplaced radial head  fracture  Patient with difficulty extending his left thumb as well extending his wrist.  Concern for possible radial nerve neuropraxia.  X-ray confirms isolated radial head fracture.  Discussed with Dr. Janee Morn of hand surgery.  He agrees likely isolated neuropraxia possibly due to pain and difficult exam.  Recommends placing patient in posterior splint as well as sling and he will see in the office for follow-up next week.  Patient placed in splint and sling. Given pain control, ice elevation at home.  Advised that Dr. Carollee Massed office will call him for followup next week.  Return precautions discussed.   Final Clinical Impressions(s) / ED Diagnoses   Final diagnoses:  Closed nondisplaced fracture of  head of left radius, initial encounter    ED Discharge Orders    None       Ednah Hammock, Jeannett SeniorStephen, MD 02/26/19 1710

## 2019-02-26 ENCOUNTER — Emergency Department (HOSPITAL_COMMUNITY): Payer: Self-pay | Attending: Emergency Medicine

## 2019-02-26 MED ORDER — HYDROCODONE-ACETAMINOPHEN 5-325 MG PO TABS: 1.0000 | ORAL_TABLET | Freq: Four times a day (QID) | ORAL | 0 refills | Status: AC | PRN

## 2019-02-26 MED ORDER — ACETAMINOPHEN 500 MG PO TABS
1000.0000 mg | ORAL_TABLET | Freq: Once | ORAL | Status: AC
  Administered 2019-02-26: 1000 mg via ORAL
  Filled 2019-02-26: qty 2

## 2019-02-26 NOTE — ED Notes (Signed)
Patient refused sling. Patient states he already has one.

## 2019-02-26 NOTE — ED Provider Notes (Signed)
.  Splint Application  Date/Time: 02/26/2019 3:14 AM Performed by: Abigail Butts, PA-C Authorized by: Abigail Butts, PA-C   Consent:    Consent obtained:  Verbal   Consent given by:  Patient   Risks discussed:  Discoloration, numbness, pain and swelling   Alternatives discussed:  No treatment Pre-procedure details:    Sensation:  Normal   Skin color:  Flesh Procedure details:    Laterality:  Right   Location:  Elbow   Elbow:  R elbow   Splint type:  Long arm   Supplies:  Ortho-Glass Post-procedure details:    Pain:  Unchanged   Sensation:  Normal   Skin color:  Flesh   Patient tolerance of procedure:  Tolerated well, no immediate complications     Arthur Wallace 02/26/19 [id number]    Ezequiel Essex, MD 02/26/19 1708

## 2019-02-26 NOTE — ED Notes (Signed)
Pt ambulated out of the ED in no distress 

## 2019-02-26 NOTE — Discharge Instructions (Signed)
Dr. Biagio Borg office will call you to arrange follow-up this week to assess your fracture and a problem with your nerve.  Take the pain medication as prescribed.  Keep the arm elevated and use ice as needed.  Wear the splint and sling.  Return to the ED if you develop new or worsening symptoms.

## 2020-08-10 IMAGING — CR DG ELBOW COMPLETE 3+V*L*
4 series · 4 of 4 positions shown · non-contrast
Comparison: None.

CLINICAL DATA: Pain

EXAM:
LEFT ELBOW - COMPLETE 3+ VIEW

[x elbow lat left]
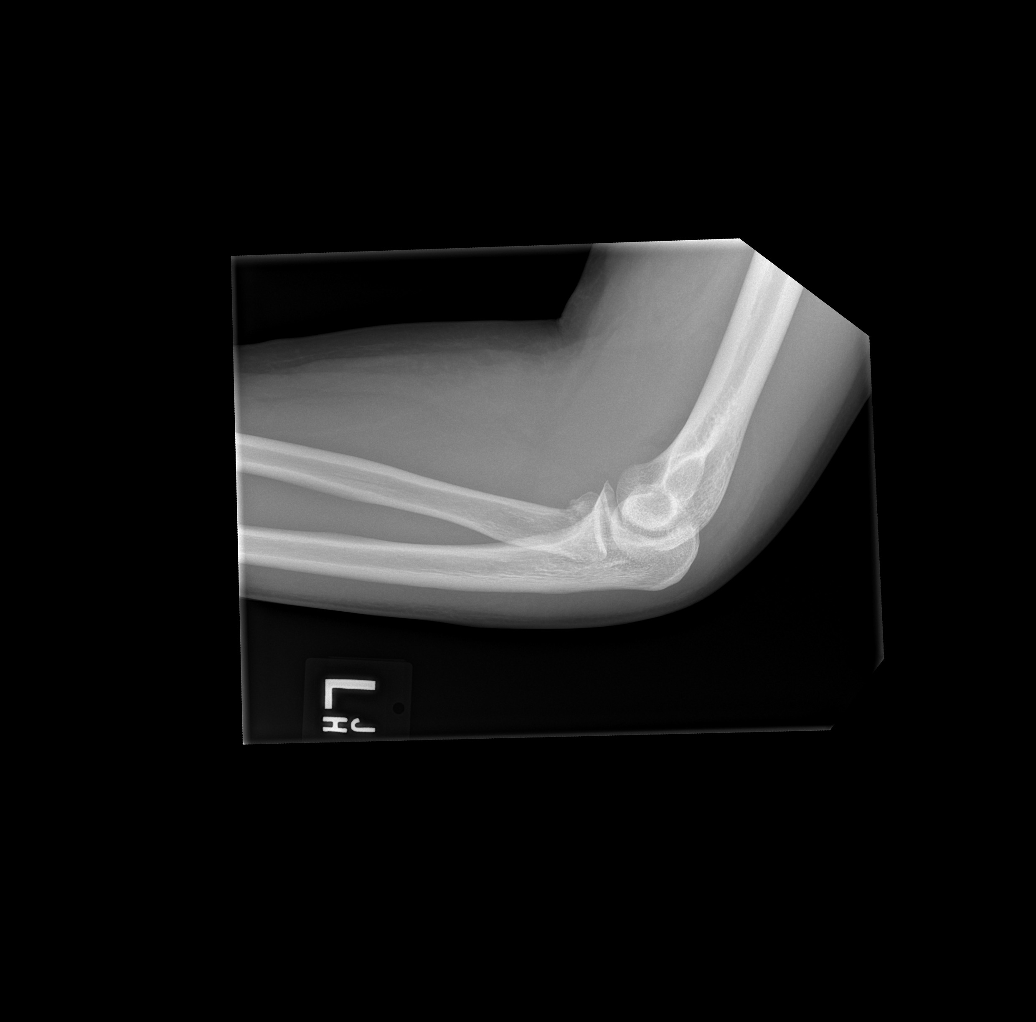

[x elbow ap left (1 of 2)]
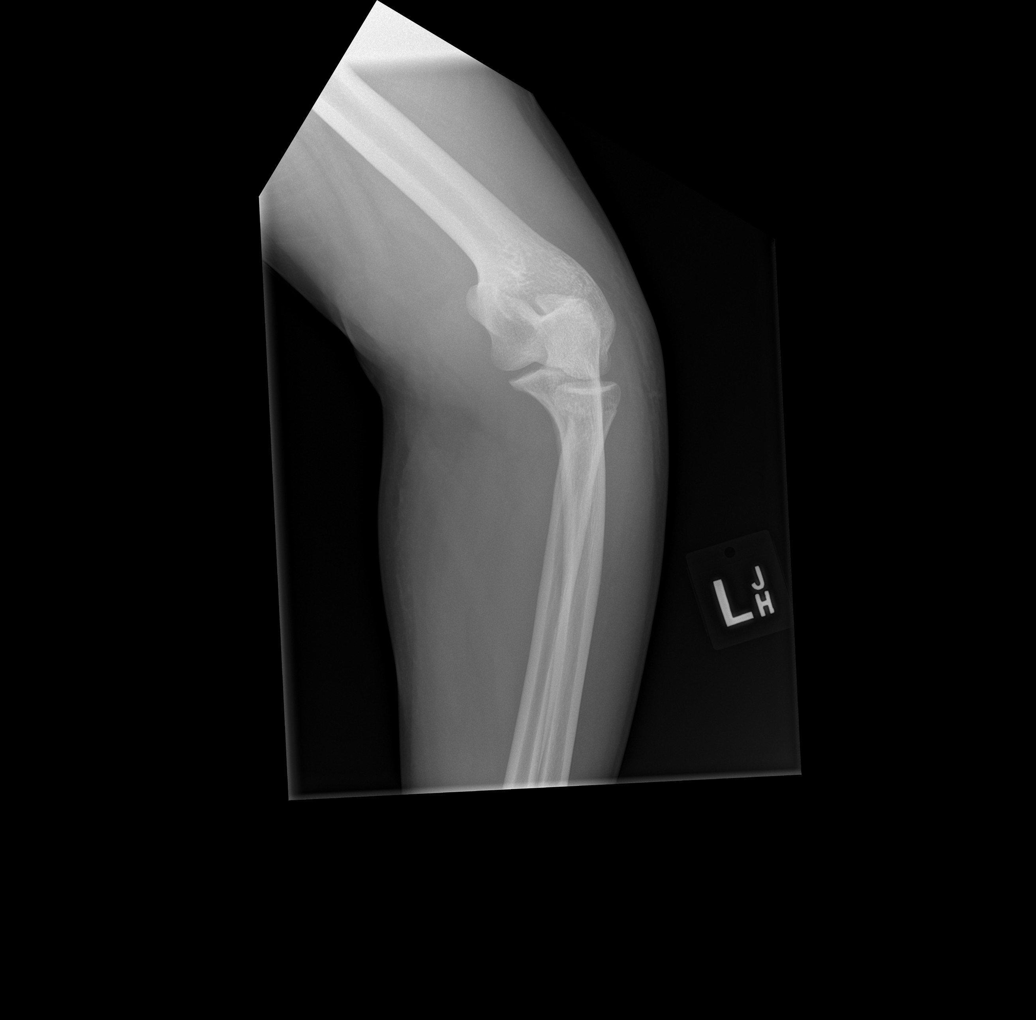

[x elbow ap left (2 of 2)]
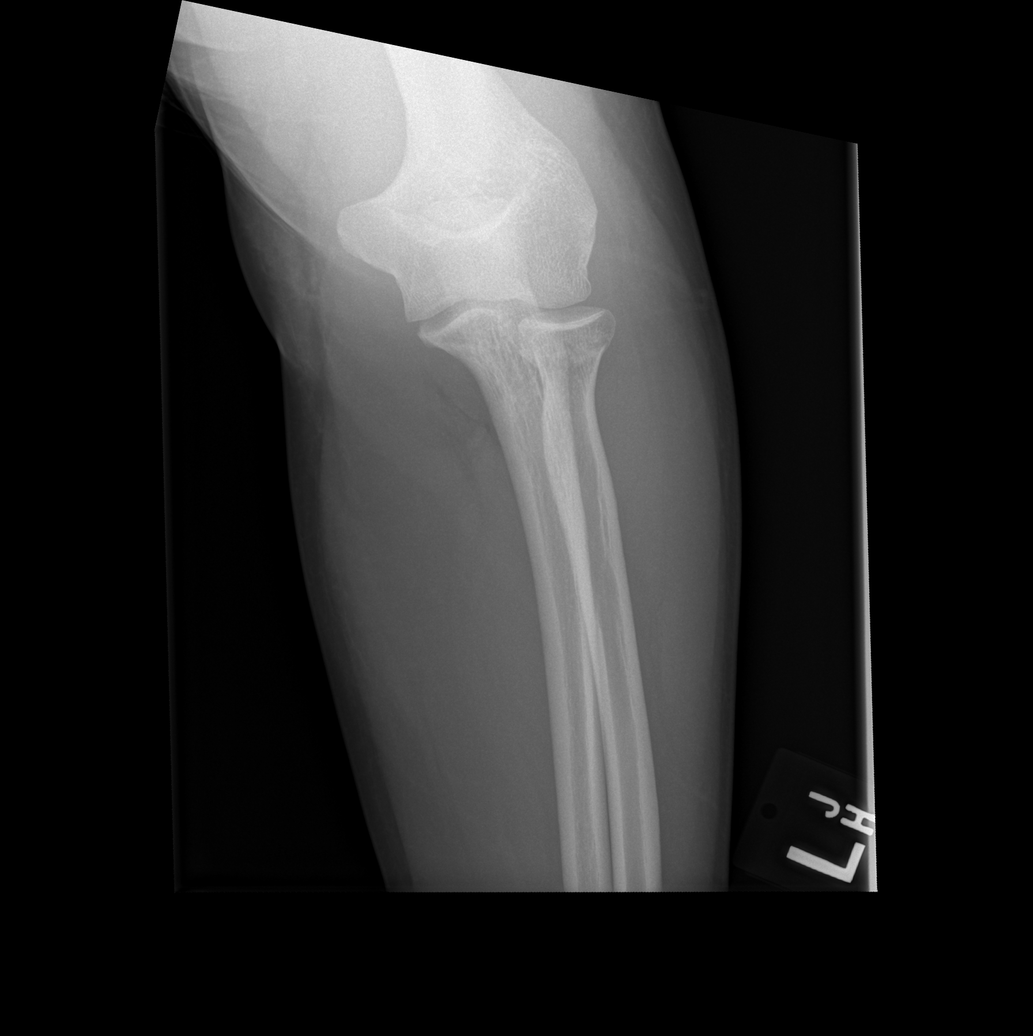

[x elbow obl left]
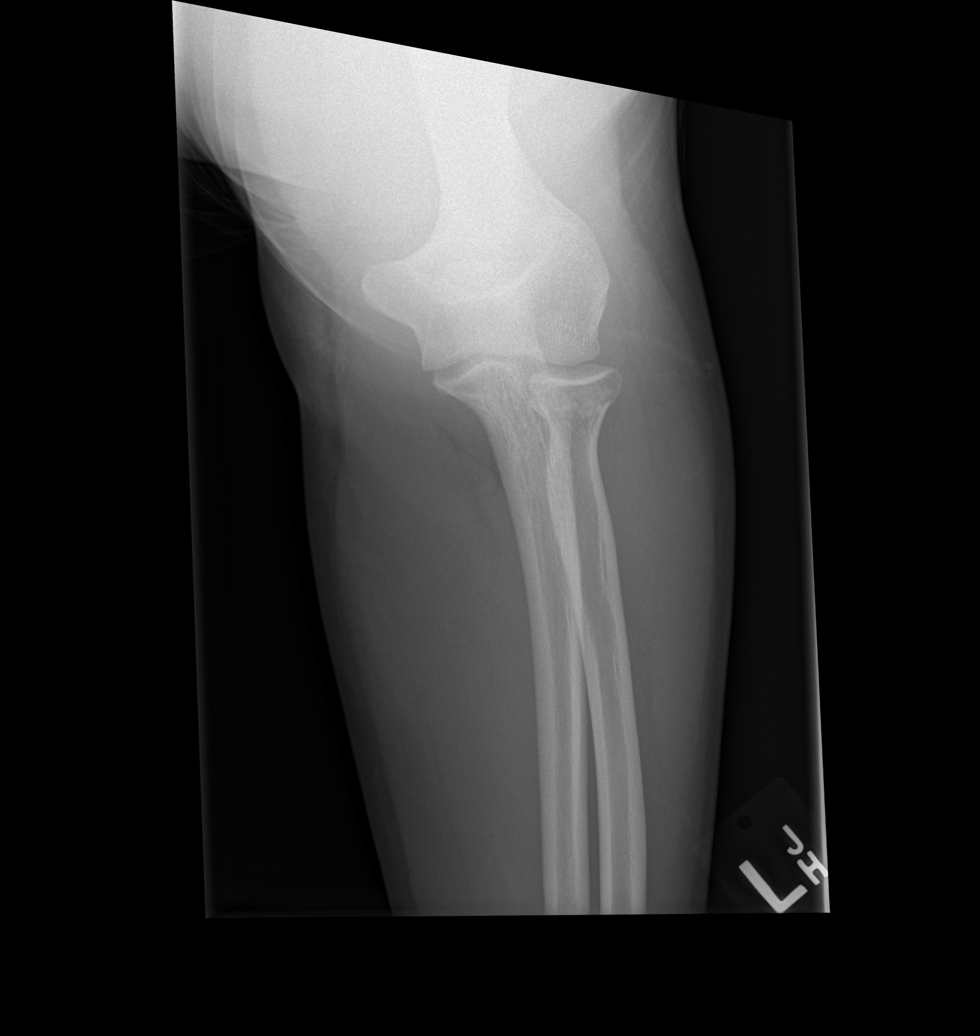

[4 of 4 positions shown; findings below may reference images not displayed]

FINDINGS: There is an acute fracture of the radial head. There is a large
joint effusion. There is no dislocation.
IMPRESSION: Acute fracture of the radial head.
